# Patient Record
Sex: Male | Born: 1935 | Race: Black or African American | Hispanic: No | Marital: Single | Smoking: Never smoker
Health system: Southern US, Community
[De-identification: ages and names within clinical notes are randomized; demographics above are authoritative.]

## PROBLEM LIST (undated history)

## (undated) DIAGNOSIS — I10 Essential (primary) hypertension: Secondary | ICD-10-CM

## (undated) DIAGNOSIS — N400 Enlarged prostate without lower urinary tract symptoms: Secondary | ICD-10-CM

## (undated) DIAGNOSIS — F3162 Bipolar disorder, current episode mixed, moderate: Secondary | ICD-10-CM

## (undated) DIAGNOSIS — E46 Unspecified protein-calorie malnutrition: Secondary | ICD-10-CM

## (undated) DIAGNOSIS — K219 Gastro-esophageal reflux disease without esophagitis: Secondary | ICD-10-CM

## (undated) DIAGNOSIS — Z79899 Other long term (current) drug therapy: Secondary | ICD-10-CM

## (undated) DIAGNOSIS — G928 Other toxic encephalopathy: Secondary | ICD-10-CM

## (undated) DIAGNOSIS — J189 Pneumonia, unspecified organism: Secondary | ICD-10-CM

## (undated) DIAGNOSIS — G92 Toxic encephalopathy: Secondary | ICD-10-CM

## (undated) DIAGNOSIS — G20 Parkinson's disease: Secondary | ICD-10-CM

## (undated) DIAGNOSIS — F0390 Unspecified dementia without behavioral disturbance: Secondary | ICD-10-CM

## (undated) DIAGNOSIS — G20A1 Parkinson's disease without dyskinesia, without mention of fluctuations: Secondary | ICD-10-CM

## (undated) HISTORY — DX: Essential (primary) hypertension: I10

## (undated) HISTORY — DX: Gastro-esophageal reflux disease without esophagitis: K21.9

## (undated) HISTORY — DX: Other toxic encephalopathy: G92.8

## (undated) HISTORY — DX: Bipolar disorder, current episode mixed, moderate: F31.62

## (undated) HISTORY — DX: Benign prostatic hyperplasia without lower urinary tract symptoms: N40.0

## (undated) HISTORY — DX: Parkinson's disease: G20

## (undated) HISTORY — DX: Unspecified protein-calorie malnutrition: E46

## (undated) HISTORY — PX: OTHER SURGICAL HISTORY: SHX169

## (undated) HISTORY — DX: Unspecified dementia, unspecified severity, without behavioral disturbance, psychotic disturbance, mood disturbance, and anxiety: F03.90

## (undated) HISTORY — DX: Toxic encephalopathy: G92

## (undated) HISTORY — DX: Parkinson's disease without dyskinesia, without mention of fluctuations: G20.A1

## (undated) HISTORY — DX: Pneumonia, unspecified organism: J18.9

## (undated) HISTORY — DX: Other long term (current) drug therapy: Z79.899

---

## 2014-06-03 ENCOUNTER — Encounter: Payer: Self-pay | Admitting: Adult Health

## 2014-06-03 ENCOUNTER — Non-Acute Institutional Stay (SKILLED_NURSING_FACILITY): Payer: Medicare PPO | Admitting: Internal Medicine

## 2014-06-03 DIAGNOSIS — K219 Gastro-esophageal reflux disease without esophagitis: Secondary | ICD-10-CM

## 2014-06-03 DIAGNOSIS — F3162 Bipolar disorder, current episode mixed, moderate: Secondary | ICD-10-CM

## 2014-06-03 DIAGNOSIS — G929 Unspecified toxic encephalopathy: Secondary | ICD-10-CM

## 2014-06-03 DIAGNOSIS — G92 Toxic encephalopathy: Secondary | ICD-10-CM

## 2014-06-03 DIAGNOSIS — F03918 Unspecified dementia, unspecified severity, with other behavioral disturbance: Secondary | ICD-10-CM

## 2014-06-03 DIAGNOSIS — I158 Other secondary hypertension: Secondary | ICD-10-CM

## 2014-06-03 DIAGNOSIS — G928 Other toxic encephalopathy: Secondary | ICD-10-CM

## 2014-06-03 DIAGNOSIS — F0390 Unspecified dementia without behavioral disturbance: Secondary | ICD-10-CM | POA: Insufficient documentation

## 2014-06-03 DIAGNOSIS — N400 Enlarged prostate without lower urinary tract symptoms: Secondary | ICD-10-CM | POA: Insufficient documentation

## 2014-06-03 DIAGNOSIS — J189 Pneumonia, unspecified organism: Secondary | ICD-10-CM | POA: Insufficient documentation

## 2014-06-03 DIAGNOSIS — G20 Parkinson's disease: Secondary | ICD-10-CM

## 2014-06-03 DIAGNOSIS — I10 Essential (primary) hypertension: Secondary | ICD-10-CM | POA: Insufficient documentation

## 2014-06-03 DIAGNOSIS — E46 Unspecified protein-calorie malnutrition: Secondary | ICD-10-CM | POA: Insufficient documentation

## 2014-06-03 DIAGNOSIS — F0391 Unspecified dementia with behavioral disturbance: Secondary | ICD-10-CM

## 2014-06-03 NOTE — Assessment & Plan Note (Signed)
He is on sinemet with a mild resting tremor. We do not have further records to review this issue from Neuro. Continue Sinemet and monitor. Fall and aspiration precautions.

## 2014-06-03 NOTE — Assessment & Plan Note (Signed)
He is on Aricept and tolerating well. He clearly has moderate dementia. He has functional capabilities and is fairly independent with ADLs. He would be most appropriate in assisted living once his rehabilitation is complete.

## 2014-06-03 NOTE — Assessment & Plan Note (Signed)
-   Completed course of antibiotics - Resolved 

## 2014-06-03 NOTE — Assessment & Plan Note (Signed)
Resolved

## 2014-06-03 NOTE — Assessment & Plan Note (Signed)
He is on Prilosec BID, no reports of heartburn. Continue to monitor.

## 2014-06-03 NOTE — Progress Notes (Signed)
Patient ID: Edward Maddox, male   DOB: [birth year], 80 y.o.   MRN: [id number]   [facility] (31)  Code Status:  Full code  Chief Complaint  Patient presents with  . admission    HPI: This is a pleasant, 80 y.o. AA male admitted to [place] for STR after a stay at [facility] in [place], [place] secondary to pneumonia (05/15/14-05/26/14). He was apparently living at another facility and was found confused in urine and feces and having frequent falls. He had a significant functional decline that precipitated his hospitalization. He was treated for HCAP and a UTI with Levaquin and Zosyn. I do not see a urine culture in the records. He continue to have hematuria and urinary retention and was seen by urology. He as noted to have BPH and placed on Flomax and Proscar. He denies any difficulty voiding currently. He had TME during his stay but this seems to have resolved to his baseline. He has a hx of dementia and is on Aricept. He is a poor historian. He was diagnosed with possible PD and placed on Sinemet during his stay. He also had an episode of hypotension and decreased responsiveness during his stay which has resolved. He has a hx of Bipolar with psychotic features that is seems to be under control during my visit. He is pleasant, follows commands, but is not able to give [place] any further information about his history or functional status. According to the discharge summary his goals are to receive therapy and have placement in assisted living.    Allergies  Allergen Reactions  . Lipitor [Atorvastatin]     MEDICATIONS -   Current outpatient prescriptions:amLODipine (NORVASC) 5 MG tablet, Take 5 mg by mouth daily., Disp: , Rfl: ;  carbidopa-levodopa (SINEMET IR) 25-100 MG per tablet, Take 1 tablet by mouth 2 (two) times daily., Disp: , Rfl: ;  divalproex (DEPAKOTE) 250 MG DR tablet, Take 500 mg by mouth 2 (two) times daily., Disp: , Rfl: ;  donepezil (ARICEPT) 10 MG tablet, Take  10 mg by mouth at bedtime., Disp: , Rfl:  finasteride (PROSCAR) 5 MG tablet, Take 5 mg by mouth daily., Disp: , Rfl: ;  lisinopril (PRINIVIL,ZESTRIL) 40 MG tablet, Take 40 mg by mouth daily., Disp: , Rfl: ;  LORazepam (ATIVAN) 0.5 MG tablet, Take 0.5 mg by mouth every 6 (six) hours as needed for anxiety., Disp: , Rfl: ;  omeprazole (PRILOSEC) 20 MG capsule, Take 20 mg by mouth daily., Disp: , Rfl: ;  QUEtiapine (SEROQUEL) 25 MG tablet, Take 25 mg by mouth at bedtime., Disp: , Rfl:  tamsulosin (FLOMAX) 0.4 MG CAPS capsule, Take 0.4 mg by mouth., Disp: , Rfl: ;  traZODone (DESYREL) 50 MG tablet, Take 25 mg by mouth 2 (two) times daily., Disp: , Rfl:   DATA REVIEWED  Radiologic Exams  His admission noted indicates that he had a CT of the head that was negative, as well as an MRI that showed no structural lesions. The actual reports are unobtainable.  Cardiovascular Exams  05/15/14: 12 lead EKG sinus bradycardia  Laboratory Studies 05/16/14 BUN 14, Cr 0.85, Co2 23, Ca 9.1, Cl 110, glucose 116, K 3.9, Na 141, Hgb 11.5, Hct 35.7, plt 182, WBC 4.5  REVIEW OF SYSTEMS  DATA OBTAINED: from patient, nurse, medical record, He is a poor historian. GENERAL: Feels well   No recent fever, fatigue, change in appetite or weight SKIN: No itch, rash or open wounds EYES: No eye pain,  dryness or itching  No change in vision EARS: No earache, tinnitus, change in hearing NOSE: No congestion, drainage or bleeding MOUTH/THROAT: No mouth or tooth pain  No sore throat   No difficulty chewing or swallowing RESPIRATORY: No cough, wheezing, SOB CARDIAC: No chest pain, palpitations Edema to legs GI: No abdominal pain  No nausea, vomiting,diarrhea or constipation  No heartburn or reflux  GU: No dysuria, frequency or urgency  No change in urine volume or character No nocturia or change in stream  H/o BPH MUSCULOSKELETAL: No joint pain, swelling or stiffness  No back pain  No muscle ache, pain, weakness    NEUROLOGIC: No  dizziness, fainting, headache, numbness  No change in mental status. tremor PSYCHIATRIC: No feelings of anxiety, depression  Sleeps well.  No behavior issue. H/O bipolar  PHYSICAL EXAM Filed Vitals:   06/03/14 1017  BP: 154/98  Pulse: 84  Temp: 98.8 F (37.1 C)  Resp: 18   There is no height or weight on file to calculate BMI. GENERAL APPEARANCE: No acute distress, appropriately groomed, normal body habitus. Alert, pleasant, conversant. SKIN: No diaphoresis, rash, unusual lesions, wounds HEAD: Normocephalic, atraumatic EYES: Conjunctiva/lids clear. Pupils round, reactive. EOMs intact.  EARS: External exam WNL, canals clear NOSE: No deformity or discharge. MOUTH/THROAT: Lips w/o lesions. Oral mucosa, tongue moist, w/o lesion. Oropharynx w/o redness or lesions.  NECK: Supple, full ROM. No thyroid tenderness, enlargement or nodule LYMPHATICS: No head, neck or supraclavicular adenopathy RESPIRATORY: Breathing is even, unlabored. Lung sounds are clear and full.  CARDIOVASCULAR: Heart RRR. No murmur or extra heart sounds  ARTERIAL: No carotid, aortic or femoral bruit. Difficult to palpate pedal pulses  VENOUS: No varicosities. No venous stasis skin changes  EDEMA: +1 BLE edema GASTROINTESTINAL: Abdomen is soft, non-tender, not distended w/ normal bowel sounds. No hepatic or splenic enlargement. No mass, ventral or inguinal hernia. GENITOURINARY: Bladder non tender, not distended.  MUSCULOSKELETAL: Moves all extremities with full ROM, strength and tone. Back is without kyphosis, scoliosis or spinal process tenderness. Gait is steady NEUROLOGIC: Oriented to self only. Cranial nerves 2-12 grossly intact, speech clear, no tremor. Resting tremor noted. No rigidity. PSYCHIATRIC: Mood and affect appropriate to situation  ASSESSMENT/PLAN  Pneumonia Completed course of antibiotics. Resolved.  GERD (gastroesophageal reflux disease) He is on Prilosec BID, no reports of heartburn. Continue to  monitor.  Dementia He is on Aricept and tolerating well. He clearly has moderate dementia. He has functional capabilities and is fairly independent with ADLs. He would be most appropriate in assisted living once his rehabilitation is complete.  Parkinson's disease He is on sinemet with a mild resting tremor. We do not have further records to review this issue from Neuro. Continue Sinemet and monitor. Fall and aspiration precautions.   Toxic metabolic encephalopathy Resolved  BPH (benign prostatic hyperplasia) Currently on Proscar and Flomax. Denies voiding difficulties. Continue therapy and monitor.   Bipolar disorder, current episode mixed, moderate He is on seroquel and depakote. He is pleasant and denies manic, depressive, or psychotic symptoms. Continue current therapy. Would consider weaning seroquel in the future.  HTN (hypertension) His BP is on the high side and he has bilateral edema to his legs. He is on Norvasc which could be contributing to this issue.   Baltazar Najjar M.D./Christina Wert RN, MSN  06/03/2014  The patient was reviewed and examined with Fletcher Anon MSN. Patient became to [place] in the fall of 2014 from [place] where he was living with a friend. The  move was prompted by problems related to advancing dementia. For a while he lives with his sister eventually admitted [facility]. He was apparently diagnosed with bipolar disease although 2 sisters who are present and family deny this. Any case he was discharged to [facility] which is an apparent assisted living [place] [facility] [place]. He was admitted to hospital in June with mental status changes. He was found at the assisted living lying in a mattress that was covered in urine and feces. He had had multiple falls. He was felt to have a UTI and possible healthcare acquired pneumonia. He was treated with broad-spectrum antibiotic therapy including Levaquin and Zosyn. He  was also noted to have hematuria, urinary retention and was felt to have BPH. He had acute renal failure that resolved with IV fluid. His mental status gradually stabilized. He was also felt to have a component of Parkinson's disease and was started on low-dose Sinemet on 6/17 with an apparent good response. He was also on Depakote. He had episodes of hypotension and bradycardia and his blood pressure medications were adjusted. On 6/25 he was found to be hypotensive and unresponsive this responded to IV fluid CT scan of the head was negative troponin EKG were unchanged showing sinus bradycardia. At that point the family requested a transfer to [facility] in [place]. His bradycardia and hypotension improved and his medications including lorazepam/trazodone/Metroprolol were all stopped that. He was sent to this facility from [facility] in [place].  Problem list #1 encephalopathy thought to be secondary to UTI and pneumonia. He seemed to do better off the trazodone switched to Seroquel and benzos were stopped. #2 decreased level of responsiveness associated with hypotension and bradycardia on 6/25 prompted transfer to [facility] [place]. There was no seizure activity noted CT scan of the head were negative troponins were negative EKG showed no changes. The hypotension was felt to be secondary to medications and dehydration. His bradycardia responded to discontinuing his beta blocker therapy. Low-dose lisinopril was restarted #3 BPH/urinary retention at some point his Foley catheter was removed he appears to be voiding adequately. #4 dementia; caution with sedating medications was recommended #5 bipolar disorder again the family vehemently denies this. He apparently has had previous psychotic features. He is on Depakote. #6 possible Parkinson's disease MRI was negative. He apparently responded to low-dose Sinemet #7 moderate protein calorie malnutrition   Physical  examination Gen. is fairly clear the patient has severe dementia. He reduced 3 women the room as his sisters although one was his niece he is unable to state his age date of birth or where he was born. Clear entry bilaterally Cardiac heart sounds are normal no murmurs he appears to be euvolemic Abdomen no liver no spleen no tenderness GU; at some point his Foley catheter was removed his bladder is not distended there is no tenderness no CVA tenderness. Neurologic; severe dementia. There is no evidence of parkinsonism. He has no increased on no tremor and no bradykinesia. Of course this could be because he is on treatment??? Mental status as noted above severe dementia however he looks well this is clearly not a preterminal situation. He is able to get up and walk. His gait is fairly stable and nondescript  Impression/plan #1 advanced dementia probably Alzheimer's disease. Whether he has coexistent Parkinson's disease is difficult to tell. ALl dementia as when they're severe enough to have some parkinsonism associated nevertheless it would be difficult to be certain about  this without stopping the Sinemet, historically he apparently had a good response however. I am not prepared to do this as he is moving to an assisted living next week #2 urinary retention secondary to BPH. His family also states he has bladder stones. There is no evidence of significant urinary retention at the moment. On Flomax #3 history of bipolar disease with psychosis he is on bowel divalproex 250 mg 2 tablets twice a day. He is also on Seroquel 20 5 at night and Aricept 10 mg every morning. His behavior is actually quite well controlled. He was calm and cooperative with me #4 as mentioned possible Parkinson's disease see discussion above  He is apparently going to a small assisted living in [place] with only 10 residents. This is a locked unit beyond as I know very little about it. The family seems confident. He will need  followup lab work. I note that at some point in the discharge and said they started lisinopril 10 mg daily he is now on 40 his blood pressure appears to be stable

## 2014-06-03 NOTE — Assessment & Plan Note (Signed)
He is on seroquel and depakote. He is pleasant and denies manic, depressive, or psychotic symptoms. Continue current therapy. Would consider weaning seroquel in the future.

## 2014-06-03 NOTE — Assessment & Plan Note (Signed)
His BP is on the high side and he has bilateral edema to his legs. He is on Norvasc which could be contributing to this issue.

## 2014-06-03 NOTE — Assessment & Plan Note (Signed)
Currently on Proscar and Flomax. Denies voiding difficulties. Continue therapy and monitor.

## 2014-06-07 ENCOUNTER — Emergency Department: Payer: Self-pay | Admitting: Emergency Medicine

## 2014-06-07 LAB — CBC
HCT: 31.9 % — AB (ref 40.0–52.0)
HGB: 10.1 g/dL — AB (ref 13.0–18.0)
MCH: 25.4 pg — ABNORMAL LOW (ref 26.0–34.0)
MCHC: 31.6 g/dL — ABNORMAL LOW (ref 32.0–36.0)
MCV: 80 fL (ref 80–100)
Platelet: 212 10*3/uL (ref 150–440)
RBC: 3.98 10*6/uL — AB (ref 4.40–5.90)
RDW: 18.3 % — ABNORMAL HIGH (ref 11.5–14.5)
WBC: 6.2 10*3/uL (ref 3.8–10.6)

## 2014-06-07 LAB — ETHANOL: Ethanol: <3 mg/dL

## 2014-06-07 LAB — COMPREHENSIVE METABOLIC PANEL
ALK PHOS: 57 U/L
ALT: 14 U/L (ref 12–78)
ANION GAP: 9 (ref 7–16)
Albumin: 3.1 g/dL — ABNORMAL LOW (ref 3.4–5.0)
BUN: 18 mg/dL (ref 7–18)
Bilirubin,Total: 0.2 mg/dL (ref 0.2–1.0)
CALCIUM: 8.3 mg/dL — AB (ref 8.5–10.1)
CO2: 23 mmol/L (ref 21–32)
Chloride: 110 mmol/L — ABNORMAL HIGH (ref 98–107)
Creatinine: 1.29 mg/dL (ref 0.60–1.30)
EGFR (African American): 60 — ABNORMAL LOW
GFR CALC NON AF AMER: 52 — AB
Glucose: 132 mg/dL — ABNORMAL HIGH (ref 65–99)
Osmolality: 287 (ref 275–301)
Potassium: 3.5 mmol/L (ref 3.5–5.1)
SGOT(AST): 18 U/L (ref 15–37)
Sodium: 142 mmol/L (ref 136–145)
Total Protein: 7.2 g/dL (ref 6.4–8.2)

## 2014-06-07 LAB — URINALYSIS, COMPLETE
Bacteria: NONE SEEN
Bilirubin,UR: NEGATIVE
Blood: NEGATIVE
Glucose,UR: NEGATIVE mg/dL (ref 0–75)
KETONE: NEGATIVE
Leukocyte Esterase: NEGATIVE
Nitrite: NEGATIVE
PH: 7 (ref 4.5–8.0)
PROTEIN: NEGATIVE
RBC,UR: <1 /HPF (ref 0–5)
SQUAMOUS EPITHELIAL: NONE SEEN
Specific Gravity: 1.009 (ref 1.003–1.030)

## 2014-06-07 LAB — TROPONIN I: Troponin-I: <0.02 ng/mL

## 2014-06-07 LAB — DRUG SCREEN, URINE

## 2014-06-07 LAB — VALPROIC ACID LEVEL: Valproic Acid: 19 ug/mL — ABNORMAL LOW

## 2014-06-07 LAB — SALICYLATE LEVEL: Salicylates, Serum: <1.7 mg/dL

## 2014-06-07 LAB — ACETAMINOPHEN LEVEL: Acetaminophen: <2 ug/mL

## 2014-06-08 ENCOUNTER — Emergency Department: Payer: Self-pay | Admitting: Internal Medicine

## 2014-06-08 LAB — ETHANOL

## 2014-06-08 LAB — COMPREHENSIVE METABOLIC PANEL
ALT: 14 U/L (ref 12–78)
AST: 15 U/L (ref 15–37)
Albumin: 3 g/dL — ABNORMAL LOW (ref 3.4–5.0)
Alkaline Phosphatase: 61 U/L
Anion Gap: 4 — ABNORMAL LOW (ref 7–16)
BUN: 16 mg/dL (ref 7–18)
Bilirubin,Total: 0.3 mg/dL (ref 0.2–1.0)
CALCIUM: 8.3 mg/dL — AB (ref 8.5–10.1)
CHLORIDE: 109 mmol/L — AB (ref 98–107)
CO2: 29 mmol/L (ref 21–32)
Creatinine: 1.19 mg/dL (ref 0.60–1.30)
EGFR (African American): >60
EGFR (Non-African Amer.): 57 — ABNORMAL LOW
Glucose: 108 mg/dL — ABNORMAL HIGH (ref 65–99)
Osmolality: 285 (ref 275–301)
Potassium: 3.8 mmol/L (ref 3.5–5.1)
Sodium: 142 mmol/L (ref 136–145)
Total Protein: 7.2 g/dL (ref 6.4–8.2)

## 2014-06-08 LAB — CBC
HCT: 32.8 % — ABNORMAL LOW (ref 40.0–52.0)
HGB: 10.3 g/dL — ABNORMAL LOW (ref 13.0–18.0)
MCH: 25.4 pg — ABNORMAL LOW (ref 26.0–34.0)
MCHC: 31.4 g/dL — ABNORMAL LOW (ref 32.0–36.0)
MCV: 81 fL (ref 80–100)
PLATELETS: 204 10*3/uL (ref 150–440)
RBC: 4.06 10*6/uL — AB (ref 4.40–5.90)
RDW: 18.6 % — ABNORMAL HIGH (ref 11.5–14.5)
WBC: 5.7 10*3/uL (ref 3.8–10.6)

## 2014-06-08 LAB — SALICYLATE LEVEL: Salicylates, Serum: <1.7 mg/dL

## 2014-06-08 LAB — ACETAMINOPHEN LEVEL: Acetaminophen: <2 ug/mL

## 2014-06-20 ENCOUNTER — Emergency Department: Payer: Self-pay | Admitting: Emergency Medicine

## 2014-06-20 LAB — COMPREHENSIVE METABOLIC PANEL
ALBUMIN: 3.2 g/dL — AB (ref 3.4–5.0)
ALK PHOS: 72 U/L
ANION GAP: 6 — AB (ref 7–16)
AST: 15 U/L (ref 15–37)
BILIRUBIN TOTAL: 0.5 mg/dL (ref 0.2–1.0)
BUN: 22 mg/dL — ABNORMAL HIGH (ref 7–18)
CO2: 27 mmol/L (ref 21–32)
Calcium, Total: 8.5 mg/dL (ref 8.5–10.1)
Chloride: 108 mmol/L — ABNORMAL HIGH (ref 98–107)
Creatinine: 1.3 mg/dL (ref 0.60–1.30)
EGFR (African American): 59 — ABNORMAL LOW
EGFR (Non-African Amer.): 51 — ABNORMAL LOW
GLUCOSE: 78 mg/dL (ref 65–99)
Osmolality: 283 (ref 275–301)
POTASSIUM: 4 mmol/L (ref 3.5–5.1)
SGPT (ALT): 17 U/L
Sodium: 141 mmol/L (ref 136–145)
TOTAL PROTEIN: 7.6 g/dL (ref 6.4–8.2)

## 2014-06-20 LAB — URINALYSIS, COMPLETE
BACTERIA: NONE SEEN
BILIRUBIN, UR: NEGATIVE
Blood: NEGATIVE
Glucose,UR: NEGATIVE mg/dL (ref 0–75)
KETONE: NEGATIVE
Nitrite: NEGATIVE
PH: 6 (ref 4.5–8.0)
PROTEIN: NEGATIVE
RBC,UR: 2 /HPF (ref 0–5)
Specific Gravity: 1.017 (ref 1.003–1.030)
Squamous Epithelial: NONE SEEN

## 2014-06-20 LAB — ETHANOL

## 2014-06-20 LAB — CBC
HCT: 36.7 % — AB (ref 40.0–52.0)
HGB: 11.4 g/dL — AB (ref 13.0–18.0)
MCH: 25.1 pg — AB (ref 26.0–34.0)
MCHC: 31.2 g/dL — AB (ref 32.0–36.0)
MCV: 81 fL (ref 80–100)
Platelet: 213 10*3/uL (ref 150–440)
RBC: 4.56 10*6/uL (ref 4.40–5.90)
RDW: 17.7 % — ABNORMAL HIGH (ref 11.5–14.5)
WBC: 6.3 10*3/uL (ref 3.8–10.6)

## 2014-06-20 LAB — DRUG SCREEN, URINE
Amphetamines, Ur Screen: NEGATIVE (ref ?–1000)
BARBITURATES, UR SCREEN: NEGATIVE (ref ?–200)
Benzodiazepine, Ur Scrn: NEGATIVE (ref ?–200)
CANNABINOID 50 NG, UR ~~LOC~~: NEGATIVE (ref ?–50)
Cocaine Metabolite,Ur ~~LOC~~: NEGATIVE (ref ?–300)
MDMA (ECSTASY) UR SCREEN: NEGATIVE (ref ?–500)
METHADONE, UR SCREEN: NEGATIVE (ref ?–300)
Opiate, Ur Screen: NEGATIVE (ref ?–300)
Phencyclidine (PCP) Ur S: NEGATIVE (ref ?–25)
Tricyclic, Ur Screen: NEGATIVE (ref ?–1000)

## 2014-06-20 LAB — ACETAMINOPHEN LEVEL: Acetaminophen: <2 ug/mL

## 2014-06-20 LAB — SALICYLATE LEVEL: Salicylates, Serum: <1.7 mg/dL

## 2014-07-14 ENCOUNTER — Other Ambulatory Visit: Payer: Self-pay | Admitting: Internal Medicine

## 2015-03-14 NOTE — Consult Note (Signed)
Brief Consult Note: Diagnosis: dementia.   Patient was seen by consultant.   Comments: Psychiatry: Patient seen. No new complaint. Aff3eect smiling and polite. Confused. No idea where he is. No violence. Patient does not require inpatient psychiatric treatment and needs to be referred back to nursing home. No t under IVC.  Electronic Signatures: Audery Amellapacs, Glendora Clouatre T (MD)  (Signed 22-Jul-15 16:15)  Authored: Brief Consult Note   Last Updated: 22-Jul-15 16:15 by Audery Amellapacs, Candus Braud T (MD)

## 2015-03-14 NOTE — Consult Note (Signed)
PATIENT NAME:  Edward Maddox, Edward Maddox MR#:  [id number] DATE OF BIRTH:  1930/05/11  DATE OF CONSULTATION:  06/21/2014  CONSULTING PHYSICIAN:  Avyukth Bontempo K. Guss Bundehalla, MD  PLACE OF DICTATION:  [facility], [place].  AGE:  81 years.   SEX:  Male.  RACE:  African American.  SUBJECTIVE:  The patient was seen in consultation at [facility], 22.  The patient is an 81 year old African American male who is a poor historian and has a long history of dementia and has been living at a facility.  The patient was brought for mental status and behavior evaluation because they thought his safety was a concern at group home.  According to information obtained from the staff, the patient got to share a room with another man and he gets into constant arguments and group home is concerned the same.   OBJECTIVE:  The patient was seen sitting up comfortably in bed.  Alert, but really confused.  He said he is here to be taken to some other place and he pointed out and said, "look here, I am here so that I can go to somewhere else."  He does not appear to be responding actively to internal stimuli, does not appear to be having ideas or plans for hurting himself or others.  He knows his name and age, but he did not know the reason why he was here.  He was not aware he was here.  Cognition is below average.  Insight and judgment impaired.  IMPRESSION:  Dementia with psychosis and behavioral   problems.  RECOMMENDATIONS:  Recommend inpatient hospitalization to geriatric facility where his medications can be adjusted and he will be   redirected and stabilized and discharged appropriately.    ____________________________ Jannet MantisSurya K. Guss Bundehalla, MD skc:ds D: 06/21/2014 19:36:34 ET T: 06/21/2014 21:41:07 ET JOB#: [id number]  cc: Monika SalkSurya K. Guss Bundehalla, MD, <Dictator> Beau FannySURYA K Helene Bernstein MD ELECTRONICALLY SIGNED 06/22/2014 9:52

## 2015-03-14 NOTE — Consult Note (Signed)
PATIENT NAME:  [name], Edward MR#:  [id number] DATE OF BIRTH:  01-Nov-1930  DATE OF CONSULTATION:  06/08/2014  REFERRING PHYSICIAN:   CONSULTING PHYSICIAN:  Keionte Swicegood K. Guss Bundehalla, MD  PLACE OF DICTATION: [facility], [place].  AGE: 81 years.  RACE: African American.  SUBJECTIVE: Patient was seen in consultation in the Emergency Room at [facility]. Patient is an 81 year old African American male who is a poor historian. He has been a resident at a nursing home for the past several months. I do not know the details. According to the information obtained, patient was agitated at the nursing home and was brought here for help. However, he was observed on 06/07/2014, was sent back, and they requested that he be observed for 24 hours before he is sent back to the place.  OBJECTIVE: Patient is seen lying comfortably in bed. Alert, but is very confused. He knew his name. Could not give the details why he is here. Does not appear to be responding to internal stimuli. Does not appear to have any ideas of plans to hurt himself or others. Insight and judgment impaired. Impulse control is poor.  IMPRESSION: Dementia.  PLAN: Continue current treatment. Wait for 24 hours and social services to call back the nursing home and tell them that he is stable enough and if they would accept him back.   ____________________________ Jannet MantisSurya K. Guss Bundehalla, MD skc:sk D: 06/08/2014 19:17:54 ET T: 06/08/2014 23:23:39 ET JOB#: [id number]  cc: Monika SalkSurya K. Guss Bundehalla, MD, <Dictator> Beau FannySURYA K Ayelen Sciortino MD ELECTRONICALLY SIGNED 06/14/2014 18:05

## 2015-03-14 NOTE — Consult Note (Signed)
Brief Consult Note: Diagnosis: dementia.   Patient was seen by consultant.   Orders entered.   Discussed with Attending MD.   Comments: Psychiatry: Patient seen. 81 year old man with dementia has been in ER due to lack of safe disposition. Patient has no complaints. Remains very demented. Behavior mostly calm with occasional agitation.  Added order for risperdal 0.5mg  q6 prn agitation to add to meds at discharge. Patient has been accepted to nusing facility and will be discharged today.  Electronic Signatures: Audery Amellapacs, Eugean Arnott T (MD)  (Signed 29-Jul-15 14:12)  Authored: Brief Consult Note   Last Updated: 29-Jul-15 14:12 by Audery Amellapacs, Gerrett Loman T (MD)

## 2015-08-30 IMAGING — CT CT HEAD WITHOUT CONTRAST
1 series · 16 of 30 positions shown, 20 images · non-contrast
Comparison: None.

CLINICAL DATA: Dementia.  Weakness.

EXAM:
CT HEAD WITHOUT CONTRAST
TECHNIQUE: Contiguous axial images were obtained from the base of the skull
through the vertex without intravenous contrast.

[Series 2: head wo · axial · 0.46mm/px · z∈[-162,-18]mm · 16 of 36 slices shown, 20 images]
[im 2/36  brain]
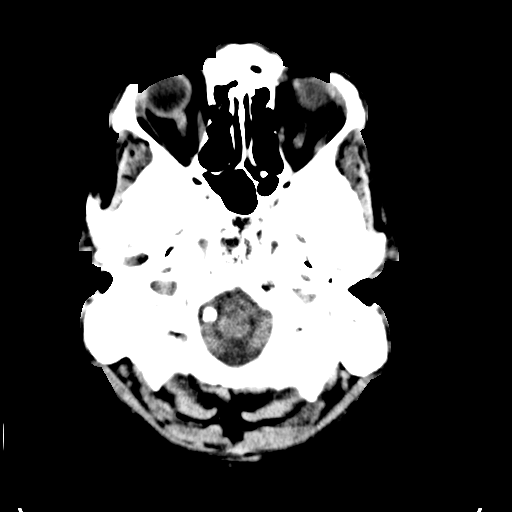
[im 2/36  bone]
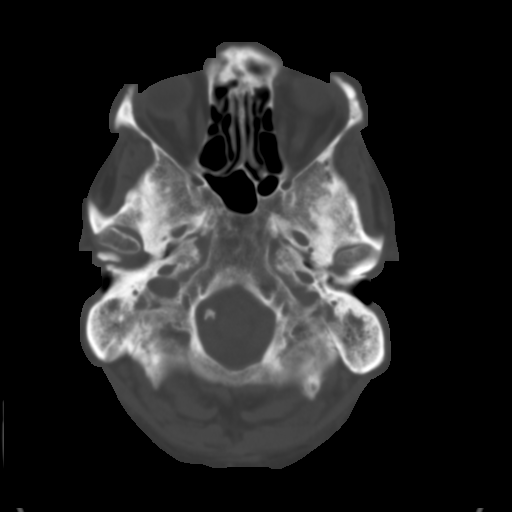
[im 4/36  brain]
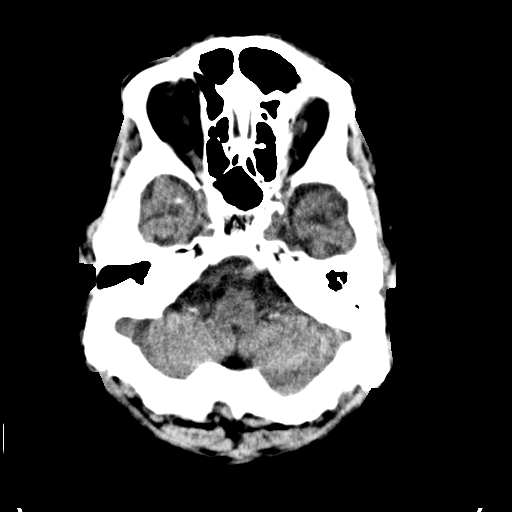
[im 7/36  brain]
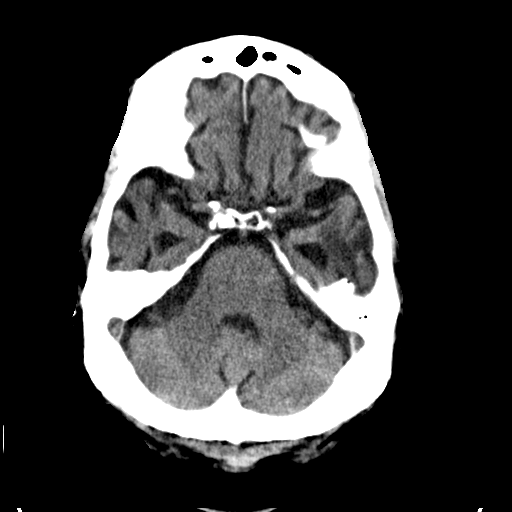
[im 9/36  brain]
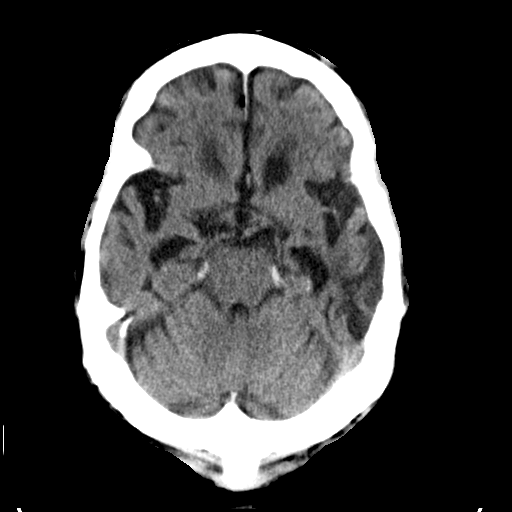
[im 10/36  brain]
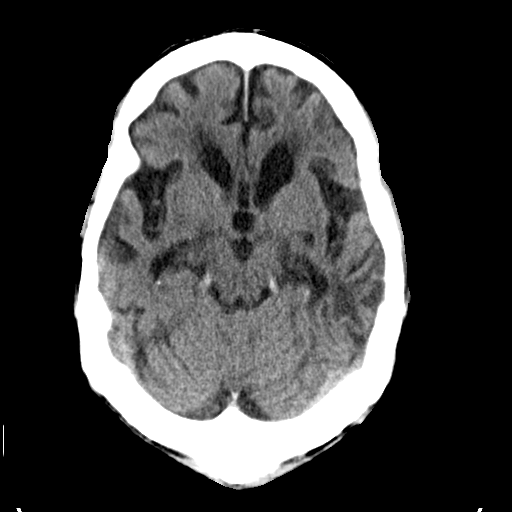
[im 10/36  bone]
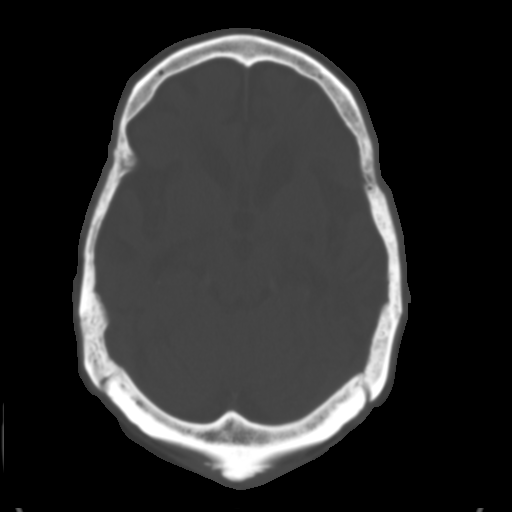
[im 13/36  brain]
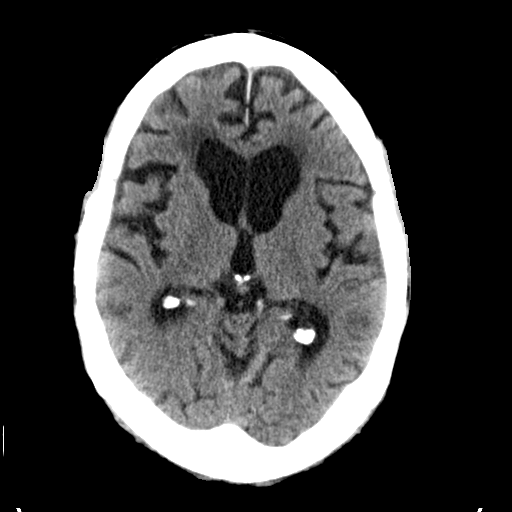
[im 15/36  brain]
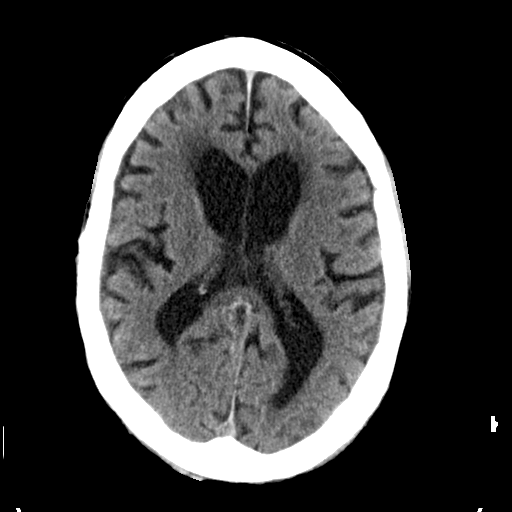
[im 17/36  brain]
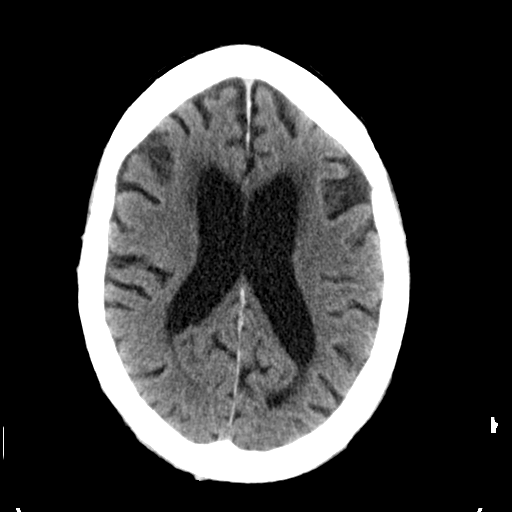
[im 19/36  brain]
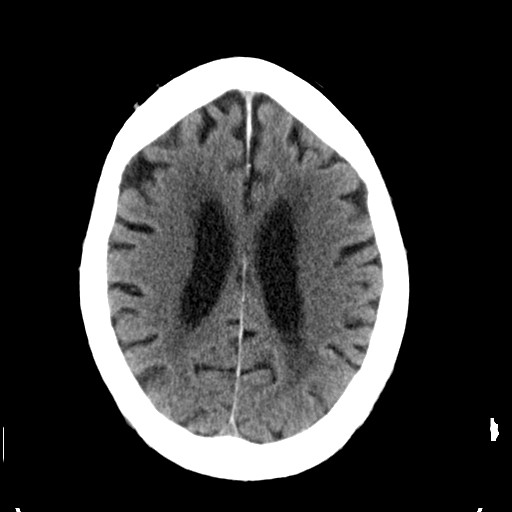
[im 19/36  bone]
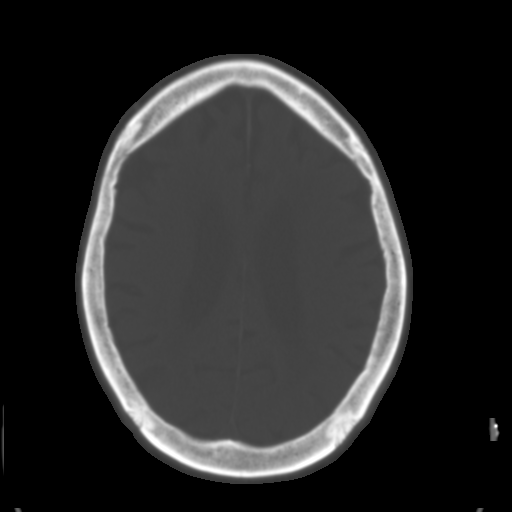
[im 21/36  brain]
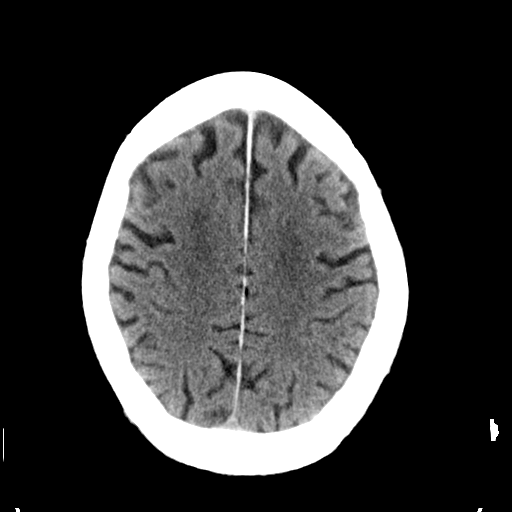
[im 23/36  brain]
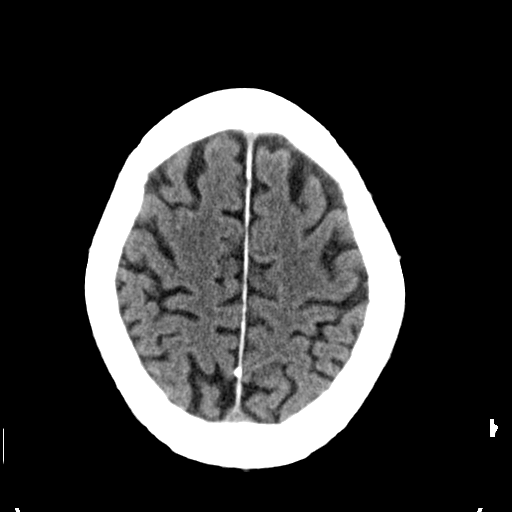
[im 26/36  brain]
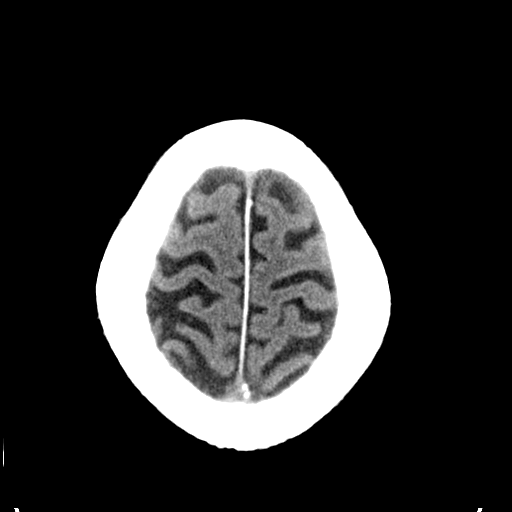
[im 27/36  brain]
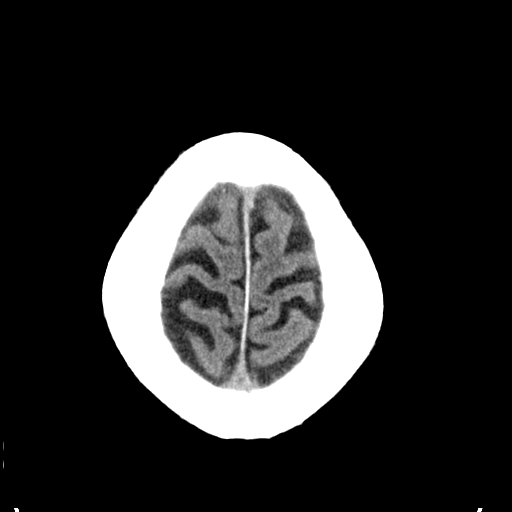
[im 27/36  bone]
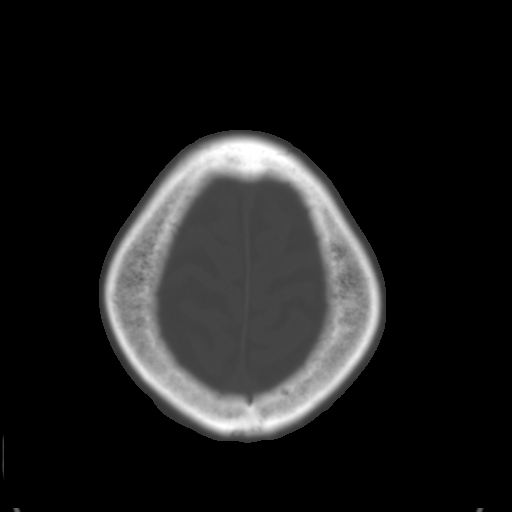
[im 29/36  brain]
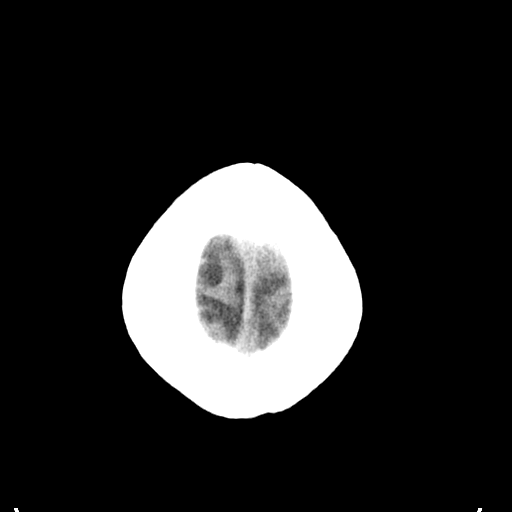
[im 32/36  brain]
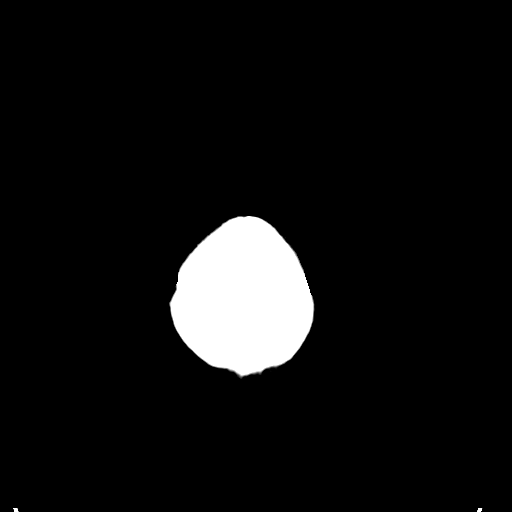
[im 34/36  brain]
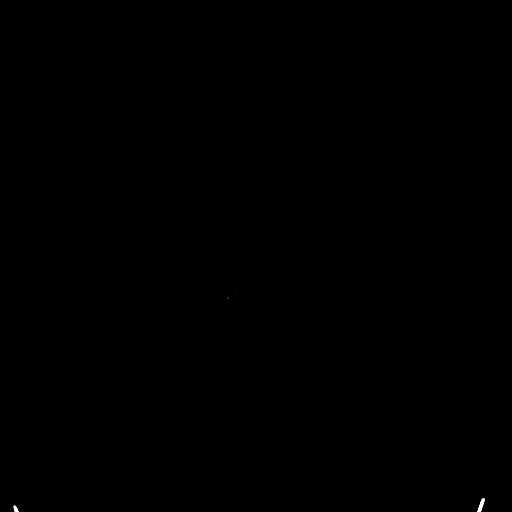

[16 of 30 positions shown; findings below may reference images not displayed]

FINDINGS: Periventricular white matter and corona radiata hypodensities favor
chronic ischemic microvascular white matter disease. Abnormal
hypodensity in the left temporal lobe laterally, images 5 through 10
of series [DATE] represent chronic or late subacute infarct.

Cerebellum, brainstem, cerebral peduncle is, thalami, and basal
ganglia unremarkable. Mild ex vacuo ventriculomegaly. No mass lesion
or intracranial hemorrhage.

There is atherosclerotic calcification of the cavernous carotid
arteries bilaterally.
IMPRESSION: 1. Chronic or potentially late subacute infarct, left temporal lobe
2. Periventricular white matter and corona radiata hypodensities
favor chronic ischemic microvascular white matter disease.

## 2015-09-14 IMAGING — CR DG LUMBAR SPINE 2-3V
1 series · 3 of 3 positions shown · non-contrast
Comparison: None.

CLINICAL DATA: Back pain, fall

EXAM:
LUMBAR SPINE - 2-3 VIEW

[Series 1: t lumbar spine ap · 0.14mm/px · 3 of 3 slices shown]
[im 1/3]
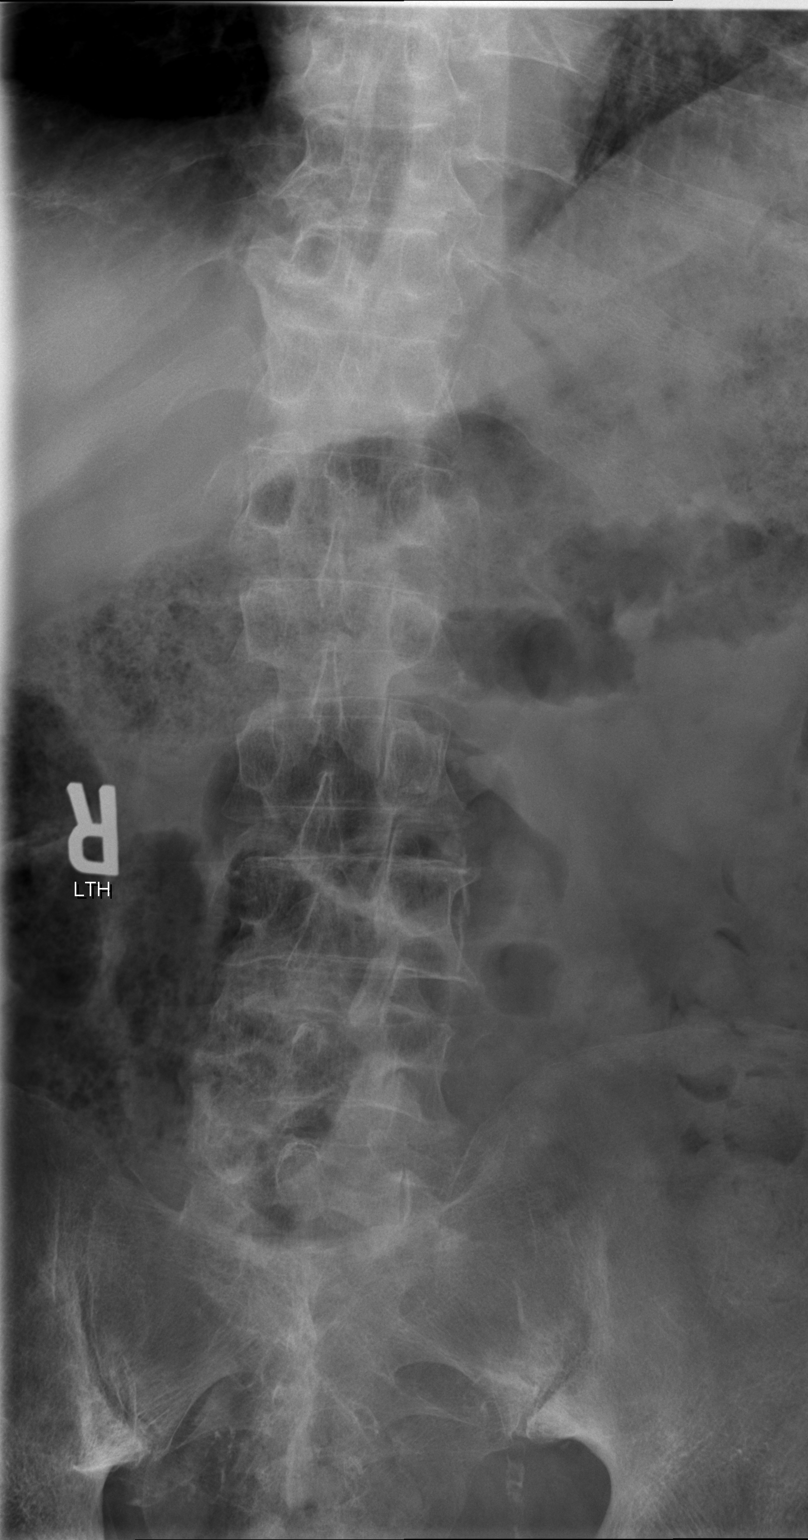
[im 2/3]
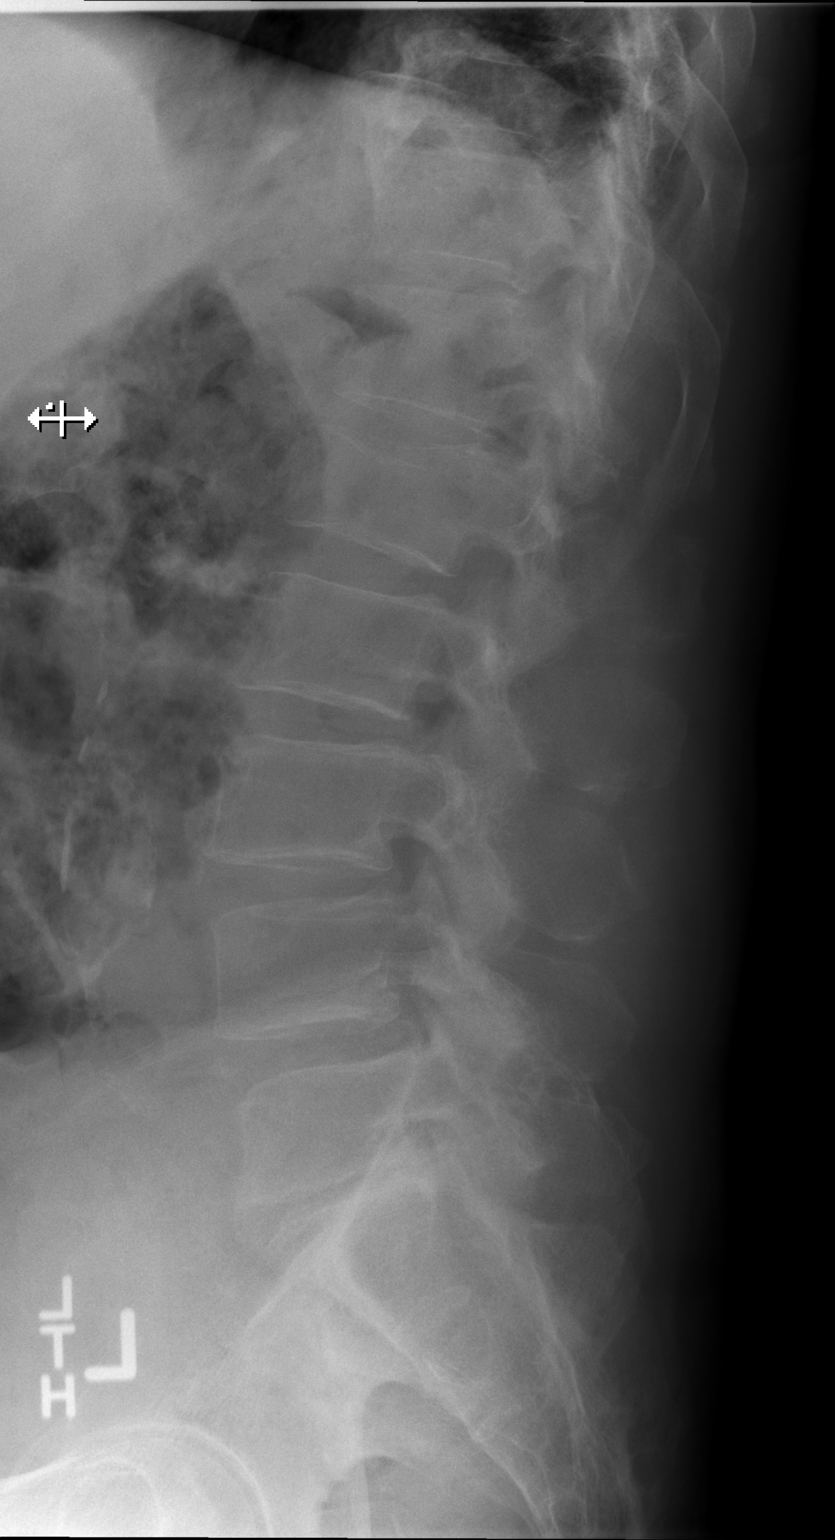
[im 3/3]
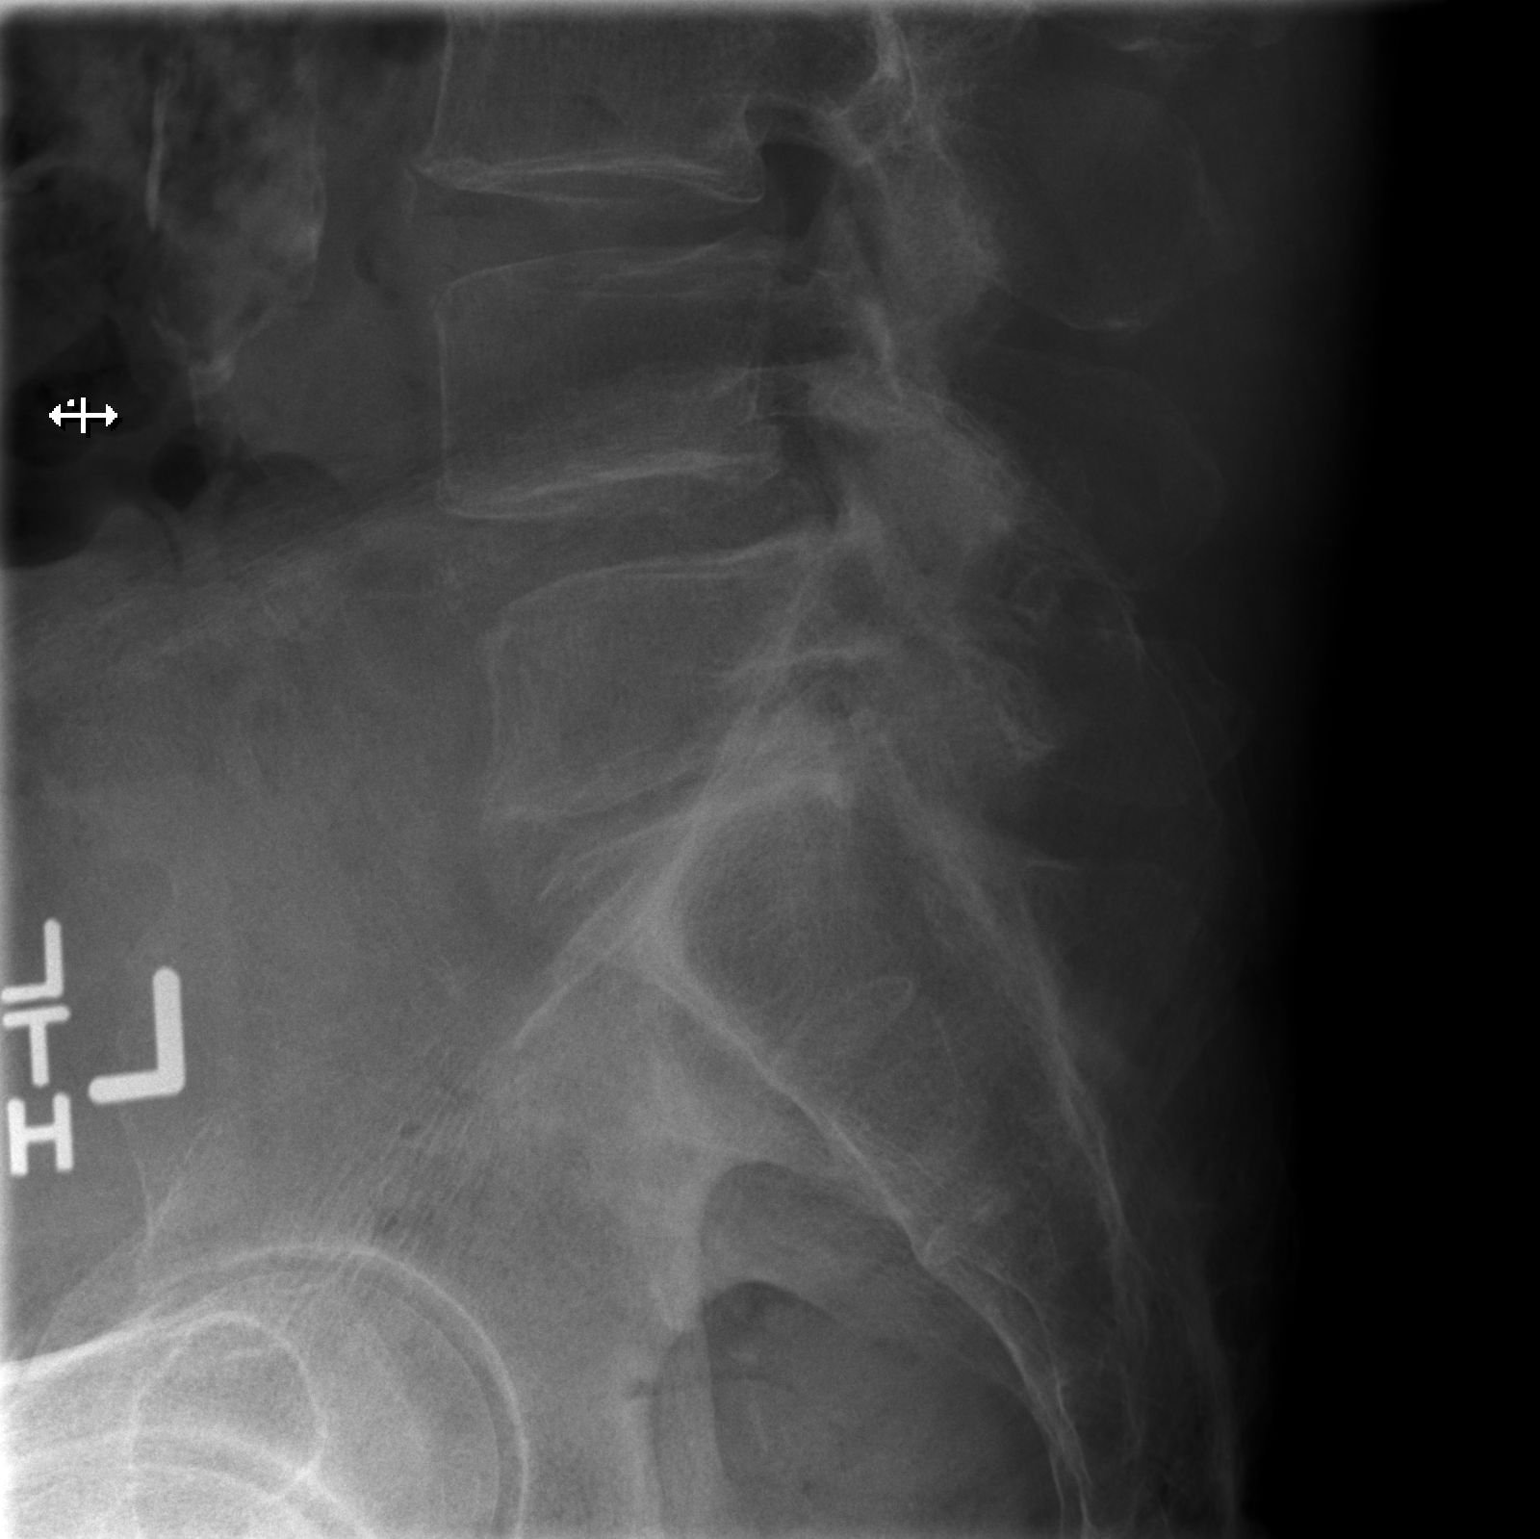

[3 of 3 positions shown; findings below may reference images not displayed]

FINDINGS: Five lumbar type vertebral bodies.

Mild anterior compression fracture deformity of the L1 vertebral
body, which approximately 20% loss of height, age indeterminate. No
retropulsion.

Mild multilevel degenerative changes.
IMPRESSION: Mild compression fracture deformity at L1, age indeterminate. No
retropulsion.

## 2017-11-21 DEATH — deceased

## 2018-01-19 DEATH — deceased
